# Patient Record
Sex: Male | Born: 1980
Health system: Southern US, Community
[De-identification: ages and names within clinical notes are randomized; demographics above are authoritative.]

## PROBLEM LIST (undated history)

## (undated) DIAGNOSIS — R569 Unspecified convulsions: Secondary | ICD-10-CM

---
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
## 2011-11-27 DIAGNOSIS — Z23 Encounter for immunization: Secondary | ICD-10-CM | POA: Diagnosis not present

## 2011-12-31 DIAGNOSIS — G9349 Other encephalopathy: Secondary | ICD-10-CM | POA: Diagnosis not present

## 2011-12-31 DIAGNOSIS — G40219 Localization-related (focal) (partial) symptomatic epilepsy and epileptic syndromes with complex partial seizures, intractable, without status epilepticus: Secondary | ICD-10-CM | POA: Diagnosis not present

## 2012-04-12 DIAGNOSIS — J3089 Other allergic rhinitis: Secondary | ICD-10-CM | POA: Diagnosis not present

## 2012-06-21 DIAGNOSIS — G9340 Encephalopathy, unspecified: Secondary | ICD-10-CM | POA: Diagnosis not present

## 2012-06-21 DIAGNOSIS — R569 Unspecified convulsions: Secondary | ICD-10-CM | POA: Diagnosis not present

## 2012-09-15 DIAGNOSIS — S20219A Contusion of unspecified front wall of thorax, initial encounter: Secondary | ICD-10-CM | POA: Diagnosis not present

## 2012-09-15 DIAGNOSIS — J00 Acute nasopharyngitis [common cold]: Secondary | ICD-10-CM | POA: Diagnosis not present

## 2012-11-21 DIAGNOSIS — R569 Unspecified convulsions: Secondary | ICD-10-CM | POA: Diagnosis not present

## 2012-11-21 DIAGNOSIS — Z23 Encounter for immunization: Secondary | ICD-10-CM | POA: Diagnosis not present

## 2012-11-21 DIAGNOSIS — G9340 Encephalopathy, unspecified: Secondary | ICD-10-CM | POA: Diagnosis not present

## 2012-12-22 DIAGNOSIS — H532 Diplopia: Secondary | ICD-10-CM | POA: Diagnosis not present

## 2013-01-05 DIAGNOSIS — J329 Chronic sinusitis, unspecified: Secondary | ICD-10-CM | POA: Diagnosis not present

## 2013-02-27 DIAGNOSIS — J3089 Other allergic rhinitis: Secondary | ICD-10-CM | POA: Diagnosis not present

## 2013-03-14 DIAGNOSIS — J00 Acute nasopharyngitis [common cold]: Secondary | ICD-10-CM | POA: Diagnosis not present

## 2013-06-30 ENCOUNTER — Emergency Department (HOSPITAL_COMMUNITY): Payer: Medicare Other

## 2013-06-30 ENCOUNTER — Encounter (HOSPITAL_COMMUNITY): Payer: Self-pay | Admitting: Emergency Medicine

## 2013-06-30 ENCOUNTER — Emergency Department (HOSPITAL_COMMUNITY)
Admission: EM | Admit: 2013-06-30 | Discharge: 2013-06-30 | Disposition: A | Discharge status: Home / Self Care | Payer: Medicare Other | Attending: Emergency Medicine | Admitting: Emergency Medicine

## 2013-06-30 DIAGNOSIS — Z79899 Other long term (current) drug therapy: Secondary | ICD-10-CM | POA: Insufficient documentation

## 2013-06-30 DIAGNOSIS — G9389 Other specified disorders of brain: Secondary | ICD-10-CM | POA: Diagnosis not present

## 2013-06-30 DIAGNOSIS — M25519 Pain in unspecified shoulder: Secondary | ICD-10-CM | POA: Diagnosis not present

## 2013-06-30 DIAGNOSIS — R51 Headache: Secondary | ICD-10-CM | POA: Diagnosis not present

## 2013-06-30 DIAGNOSIS — R569 Unspecified convulsions: Secondary | ICD-10-CM | POA: Diagnosis not present

## 2013-06-30 DIAGNOSIS — G40909 Epilepsy, unspecified, not intractable, without status epilepticus: Secondary | ICD-10-CM | POA: Insufficient documentation

## 2013-06-30 DIAGNOSIS — Z23 Encounter for immunization: Secondary | ICD-10-CM | POA: Diagnosis not present

## 2013-06-30 DIAGNOSIS — Y9229 Other specified public building as the place of occurrence of the external cause: Secondary | ICD-10-CM | POA: Insufficient documentation

## 2013-06-30 DIAGNOSIS — S01112A Laceration without foreign body of left eyelid and periocular area, initial encounter: Secondary | ICD-10-CM

## 2013-06-30 DIAGNOSIS — W1809XA Striking against other object with subsequent fall, initial encounter: Secondary | ICD-10-CM | POA: Insufficient documentation

## 2013-06-30 DIAGNOSIS — S0180XA Unspecified open wound of other part of head, initial encounter: Secondary | ICD-10-CM | POA: Diagnosis not present

## 2013-06-30 DIAGNOSIS — S4980XA Other specified injuries of shoulder and upper arm, unspecified arm, initial encounter: Secondary | ICD-10-CM | POA: Diagnosis not present

## 2013-06-30 DIAGNOSIS — Y9301 Activity, walking, marching and hiking: Secondary | ICD-10-CM | POA: Insufficient documentation

## 2013-06-30 HISTORY — DX: Unspecified convulsions: R56.9

## 2013-06-30 LAB — BASIC METABOLIC PANEL
Calcium: 9.3 mg/dL (ref 8.4–10.5)
GFR calc non Af Amer: >90 mL/min (ref >90)
Sodium: 137 mEq/L (ref 135–145)

## 2013-06-30 MED ORDER — IBUPROFEN 200 MG PO TABS
400.0000 mg | ORAL_TABLET | Freq: Once | ORAL | Status: AC
  Administered 2013-06-30: 400 mg via ORAL
  Filled 2013-06-30: qty 1

## 2013-06-30 MED ORDER — TETANUS-DIPHTH-ACELL PERTUSSIS 5-2.5-18.5 LF-MCG/0.5 IM SUSP
0.5000 mL | Freq: Once | INTRAMUSCULAR | Status: AC
  Administered 2013-06-30: 0.5 mL via INTRAMUSCULAR
  Filled 2013-06-30: qty 0.5

## 2013-06-30 NOTE — ED Notes (Signed)
Patient transported to X-ray 

## 2013-06-30 NOTE — ED Notes (Signed)
MD at bedside. 

## 2013-06-30 NOTE — ED Provider Notes (Addendum)
CSN: [id number]     Arrival date & time 06/30/13  1704 History     First MD Initiated Contact with Patient 06/30/13 1718     Chief Complaint  Patient presents with  . Seizures  . Head Injury   (Consider location/radiation/quality/duration/timing/severity/associated sxs/prior Treatment) Patient is a 32 y.o. male presenting with seizures and head injury. The history is provided by the patient.  Seizures Head Injury Associated symptoms: seizures   Associated symptoms: no neck pain, no numbness and no vomiting   pt with long hx seizure disorder, prior craniotomy w partial frontal lobectomy for same (without relief seizures), prior vagal n stimulator, who despite sz meds and above procedures continues to have a few to several seizures per week at baseline.  Today had 3 seizures, which family indicates is not unusual for pt. gen tc seizures, lasting 1-2 minutes.  Had same seizure today while walking in a store, fell forward, hit head/face, 3 cm lac to left eyebrow. Briefly posictal, now at baseline mental status. Pt c/o pain to laceration area. Also mild pain to left shoulder. Denies other pain or injury. No headache prior to seizure. No neck or back pain. No numbness/weakness. No cp or sob. No palpitations. No abd pain. No nvd. Compliant w normal meds, no recent change in meds. No recent inc in sz freq, although 3 today.  Family states often seizures will come in clusters, at baseline. Recent health, incl earlier today at baseline. No recent febrile illness.     Past Medical History  Diagnosis Date  . Seizures    Past Surgical History  Procedure Laterality Date  . Brain surgery Right 1992    2/3 frontal lobe removed.   No family history on file. History  Substance Use Topics  . Smoking status: Not on file  . Smokeless tobacco: Not on file  . Alcohol Use: Not on file    Review of Systems  Constitutional: Negative for fever and chills.  HENT: Negative for neck pain.   Eyes: Negative  for visual disturbance.  Respiratory: Negative for cough and shortness of breath.   Cardiovascular: Negative for chest pain.  Gastrointestinal: Negative for vomiting and abdominal pain.  Genitourinary: Negative for dysuria and flank pain.  Musculoskeletal: Negative for back pain.  Skin: Positive for wound. Negative for rash.  Neurological: Positive for seizures. Negative for weakness and numbness.  Hematological: Does not bruise/bleed easily.  Psychiatric/Behavioral: Negative for confusion.    Allergies  Review of patient's allergies indicates no known allergies.  Home Medications   Current Outpatient Rx  Name  Route  Sig  Dispense  Refill  . ibuprofen (ADVIL,MOTRIN) 200 MG tablet   Oral   Take 400 mg by mouth every 6 (six) hours as needed for pain.         Marland Kitchen lacosamide (VIMPAT) 200 MG TABS tablet   Oral   Take 400 mg by mouth 2 (two) times daily. Takes 2 tablets (400mg ) at 9am and 400mg  at 9pm         . lamoTRIgine (LAMICTAL) 200 MG tablet   Oral   Take 400 mg by mouth at bedtime. Takes 2 tablets (400mg ) at 9pm          BP 136/76  Pulse 94  Temp(Src) 98.4 F (36.9 C)  Resp 16  SpO2 95% Physical Exam  Nursing note and vitals reviewed. Constitutional: He is oriented to person, place, and time. He appears well-developed and well-nourished. No distress.  HENT:  Mouth/Throat: Oropharynx  is clear and moist.  3 cm left eyebrow lac. No septal hematoma. No malocclusion. No oral lac.   Eyes: Conjunctivae are normal. Pupils are equal, round, and reactive to light.  Neck: Normal range of motion. Neck supple. No tracheal deviation present.  Cardiovascular: Normal rate, regular rhythm, normal heart sounds and intact distal pulses.   Pulmonary/Chest: Effort normal and breath sounds normal. No accessory muscle usage. No respiratory distress. He exhibits no tenderness.  Abdominal: Soft. He exhibits no distension. There is no tenderness.  Genitourinary:  No cva tenderness   Musculoskeletal: Normal range of motion. He exhibits no edema and no tenderness.  Good rom bil ext, no pain or bony tenderness.   CTLS spine, non tender, aligned, no step off.   Neurological: He is alert and oriented to person, place, and time.  Motor intact bil. Steady gait.   Skin: Skin is warm and dry.  Psychiatric: He has a normal mood and affect.    ED Course   Procedures (including critical care time)  Results for orders placed during the hospital encounter of 06/30/13  BASIC METABOLIC PANEL      Result Value Range   Sodium 137  135 - 145 mEq/L   Potassium 3.9  3.5 - 5.1 mEq/L   Chloride 102  96 - 112 mEq/L   CO2 22  19 - 32 mEq/L   Glucose, Bld 97  70 - 99 mg/dL   BUN 11  6 - 23 mg/dL   Creatinine, Ser 1.61  0.50 - 1.35 mg/dL   Calcium 9.3  8.4 - 09.6 mg/dL   GFR calc non Af Amer >90  >90 mL/min   GFR calc Af Amer >90  >90 mL/min   Ct Head Wo Contrast  06/30/2013   *RADIOLOGY REPORT*  Clinical Data: Acute onset headache  CT HEAD WITHOUT CONTRAST  Technique:  Contiguous axial images were obtained from the base of the skull through the vertex without contrast. Study was obtained within 24 hours of patient arrival at the emergency department.  Comparison: None.  Findings:  The patient is status post right frontal craniotomy. There is ex vacuo phenomenon throughout the mid to superior right frontal lobe with several areas of calcification along the dura, nonacute.  There is enlargement of the lateral ventricles, more on the right than on the left.  Third and fourth ventricle appear within normal limits.  There is no mass, acute hemorrhage, extra-axial fluid, or midline shift.  There is no evidence of acute infarct.  There is some small vessel disease in the corpus callosum region anteriorly and superiorly.  Elsewhere gray-white compartments are normal.  No acute fracture is seen in the bony calvarium.  The mastoid air cells are clear.    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  Calcification along the dura in the anterior right frontal region is felt to represent residua of old trauma.  This is a chronic finding.  There is enlargement of the lateral ventricles, probably due to the ex vacuo phenomenon on the right.  There is no acute hemorrhage or mass effect.  No acute appearing infarct seen.   Original Report Authenticated By: Bretta Bang, M.D.   Dg Shoulder Left  06/30/2013   *RADIOLOGY REPORT*  Clinical Data: Pain post trauma  LEFT SHOULDER - 2+ VIEW  Comparison: None.  Findings: Frontal, oblique, and Y scapular images were obtained. There is no demonstrable fracture or dislocation.  Joint spaces appear  intact.  No erosive change or intra-articular calcifications.  There is a stimulator with lead tip in left neck region.  IMPRESSION: No fracture or dislocation.  No appreciable arthropathy.   Original Report Authenticated By: Bretta Bang, M.D.      MDM  Ct. Lab.  Reviewed nursing notes and prior charts for additional history.   Ct neg acute.  Pt requests advil for headache, states has this same kind of headache after seizures, and usually takes ibuprofen.   Motrin po.  Recheck spine nt. Mental status remains at baseline. No headache. No new c/o.  LACERATION REPAIR Performed by: Suzi Roots Authorized by: Suzi Roots Consent: Verbal consent obtained. Risks and benefits: risks, benefits and alternatives were discussed Consent given by: patient Patient identity confirmed: provided demographic data Prepped and Draped in normal sterile fashion Wound explored  Laceration Location: left eyebrow  Laceration Length: 3cm  No Foreign Bodies seen or palpated  Anesthesia: local infiltration  Local anesthetic: lidocaine 2% wo epinephrine  Anesthetic total: 3 ml  Irrigation method: syringe Amount of cleaning: standard  Skin closure: complex laceration, layered closure, skin 6-0  prolene, subcut 5-0 vicryl  Number of sutures: 3 sub cut, 7 skin  Technique: layered,complex closure  Patient tolerance: Patient tolerated the procedure well with no immediate complications.   Tetanus im.  Pt at baseline, ct head neg acute  -  Pt appears stable for d/c.      Suzi Roots, MD 06/30/13 [phone]

## 2013-06-30 NOTE — ED Notes (Signed)
Bed: WA23 Expected date:  Expected time:  Means of arrival:  Comments: EMS seizure 

## 2013-06-30 NOTE — ED Notes (Addendum)
Per EMS patient with Hx of fall in 1992 with injury requiring removal of 2/3 of right frontal lobe of the brain, and a second surgery in 1995 to place vagal nerve stimulator in his chest. Per EMS patient had 3 seizures today and fell in the most recent seizure, with a head laceration above and below the left eye which is bleeding into the eye. 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 EMS reports that patient is still post-ictal. Patient follows commands and is able to ambulate unsteadily. Per family patient is "normal" mentally at baseline and can completely care for himself, but that he has ongoing seizures anywhere from several times per day to several days without a seizure, after which the patient has a headache and confusion. Family states they witnessed patient fall headfirst onto ground. Patient complains of pain in head and left arm.

## 2013-07-07 ENCOUNTER — Emergency Department (HOSPITAL_COMMUNITY)
Admission: EM | Admit: 2013-07-07 | Discharge: 2013-07-07 | Disposition: A | Discharge status: Home / Self Care | Payer: Medicare Other | Attending: Emergency Medicine | Admitting: Emergency Medicine

## 2013-07-07 ENCOUNTER — Encounter (HOSPITAL_COMMUNITY): Payer: Self-pay | Admitting: Emergency Medicine

## 2013-07-07 DIAGNOSIS — G40909 Epilepsy, unspecified, not intractable, without status epilepticus: Secondary | ICD-10-CM | POA: Diagnosis not present

## 2013-07-07 DIAGNOSIS — Z79899 Other long term (current) drug therapy: Secondary | ICD-10-CM | POA: Diagnosis not present

## 2013-07-07 DIAGNOSIS — Z4802 Encounter for removal of sutures: Secondary | ICD-10-CM | POA: Diagnosis not present

## 2013-07-07 NOTE — ED Provider Notes (Signed)
Medical screening examination/treatment/procedure(s) were performed by non-physician practitioner and as supervising physician I was immediately available for consultation/collaboration.   Gwyneth Sprout, MD 07/07/13 [phone]

## 2013-07-07 NOTE — ED Provider Notes (Signed)
  CSN: [id number]     Arrival date & time 07/07/13  1336 History     None    Chief Complaint  Patient presents with  . Suture / Staple Removal   (Consider location/radiation/quality/duration/timing/severity/associated sxs/prior Treatment) HPI Comments: Patient presents today to have sutures removed from his left eyebrow area.  Sutures were placed seven days ago.  He denies fever or chills.  No drainage from the wound.  He reports that the area has been healing well.  He has not been applying any treatment to the area.  Patient is a 32 y.o. male presenting with suture removal. The history is provided by the patient.  Suture / Staple Removal    Past Medical History  Diagnosis Date  . Seizures    Past Surgical History  Procedure Laterality Date  . Brain surgery Right 1992    2/3 frontal lobe removed.   No family history on file. History  Substance Use Topics  . Smoking status: Never Smoker   . Smokeless tobacco: Not on file  . Alcohol Use: No    Review of Systems  Allergies  Review of patient's allergies indicates no known allergies.  Home Medications   Current Outpatient Rx  Name  Route  Sig  Dispense  Refill  . clonazePAM (KLONOPIN) 0.5 MG tablet   Oral   Take 0.5 mg by mouth at bedtime as needed for anxiety.         Marland Kitchen ibuprofen (ADVIL,MOTRIN) 200 MG tablet   Oral   Take 400 mg by mouth every 6 (six) hours as needed for pain.         Marland Kitchen lacosamide (VIMPAT) 200 MG TABS tablet   Oral   Take 400 mg by mouth 2 (two) times daily. Takes 2 tablets (400mg ) at 9am and 400mg  at 9pm         . lamoTRIgine (LAMICTAL) 200 MG tablet   Oral   Take 400 mg by mouth at bedtime. Takes 2 tablets (400mg ) at 9pm          BP 122/73  Pulse 72  Temp(Src) 98.5 F (36.9 C) (Oral)  Resp 20  SpO2 100% Physical Exam  Nursing note and vitals reviewed. Constitutional: He appears well-developed and well-nourished. No distress.  HENT:  Head: Normocephalic.  Cardiovascular:  Normal rate, regular rhythm and normal heart sounds.   Pulmonary/Chest: Effort normal and breath sounds normal.  Neurological: He is alert.  Skin: Skin is warm and dry. He is not diaphoretic.  Laceration appears to be healing well.  Skin well approximated.  No drainage.  No surrounding erythema, edema, or swelling.  Psychiatric: He has a normal mood and affect.    ED Course   Procedures (including critical care time)  Labs Reviewed - No data to display No results found. No diagnosis found.  MDM  Patient presenting for suture removal.  No signs of infection at this time.  Sutures removed without difficulty.  Patient stable for discharge.  Pascal Lux [place], PA-C 07/07/13 1426

## 2013-07-07 NOTE — ED Notes (Signed)
Pt back for suture removal over left eye.

## 2013-07-10 DIAGNOSIS — Z462 Encounter for fitting and adjustment of other devices related to nervous system and special senses: Secondary | ICD-10-CM | POA: Diagnosis not present

## 2013-07-10 DIAGNOSIS — R569 Unspecified convulsions: Secondary | ICD-10-CM | POA: Diagnosis not present

## 2013-07-10 DIAGNOSIS — G9340 Encephalopathy, unspecified: Secondary | ICD-10-CM | POA: Diagnosis not present

## 2013-09-01 DIAGNOSIS — Z23 Encounter for immunization: Secondary | ICD-10-CM | POA: Diagnosis not present

## 2013-10-16 DIAGNOSIS — G40219 Localization-related (focal) (partial) symptomatic epilepsy and epileptic syndromes with complex partial seizures, intractable, without status epilepticus: Secondary | ICD-10-CM | POA: Diagnosis not present

## 2013-10-16 DIAGNOSIS — Z9889 Other specified postprocedural states: Secondary | ICD-10-CM | POA: Diagnosis not present

## 2013-10-16 DIAGNOSIS — G9340 Encephalopathy, unspecified: Secondary | ICD-10-CM | POA: Diagnosis not present

## 2014-01-24 DIAGNOSIS — R51 Headache: Secondary | ICD-10-CM | POA: Diagnosis not present

## 2014-01-24 DIAGNOSIS — H01019 Ulcerative blepharitis unspecified eye, unspecified eyelid: Secondary | ICD-10-CM | POA: Diagnosis not present

## 2014-02-13 DIAGNOSIS — G40219 Localization-related (focal) (partial) symptomatic epilepsy and epileptic syndromes with complex partial seizures, intractable, without status epilepticus: Secondary | ICD-10-CM | POA: Diagnosis not present

## 2014-02-13 DIAGNOSIS — Z9889 Other specified postprocedural states: Secondary | ICD-10-CM | POA: Diagnosis not present

## 2014-03-15 DIAGNOSIS — J3089 Other allergic rhinitis: Secondary | ICD-10-CM | POA: Diagnosis not present

## 2014-07-10 DIAGNOSIS — Z9889 Other specified postprocedural states: Secondary | ICD-10-CM | POA: Diagnosis not present

## 2014-07-10 DIAGNOSIS — G9340 Encephalopathy, unspecified: Secondary | ICD-10-CM | POA: Diagnosis not present

## 2014-07-10 DIAGNOSIS — R569 Unspecified convulsions: Secondary | ICD-10-CM | POA: Diagnosis not present

## 2014-07-10 DIAGNOSIS — G40219 Localization-related (focal) (partial) symptomatic epilepsy and epileptic syndromes with complex partial seizures, intractable, without status epilepticus: Secondary | ICD-10-CM | POA: Diagnosis not present

## 2014-08-03 DIAGNOSIS — R569 Unspecified convulsions: Secondary | ICD-10-CM | POA: Diagnosis not present

## 2014-08-29 DIAGNOSIS — Z23 Encounter for immunization: Secondary | ICD-10-CM | POA: Diagnosis not present

## 2014-09-29 DIAGNOSIS — S62608A Fracture of unspecified phalanx of other finger, initial encounter for closed fracture: Secondary | ICD-10-CM | POA: Diagnosis not present

## 2014-09-29 DIAGNOSIS — S62600A Fracture of unspecified phalanx of right index finger, initial encounter for closed fracture: Secondary | ICD-10-CM | POA: Diagnosis not present

## 2014-09-29 DIAGNOSIS — S62647A Nondisplaced fracture of proximal phalanx of left little finger, initial encounter for closed fracture: Secondary | ICD-10-CM | POA: Diagnosis not present

## 2014-09-29 DIAGNOSIS — W1839XA Other fall on same level, initial encounter: Secondary | ICD-10-CM | POA: Diagnosis not present

## 2014-09-29 DIAGNOSIS — S62645A Nondisplaced fracture of proximal phalanx of left ring finger, initial encounter for closed fracture: Secondary | ICD-10-CM | POA: Diagnosis not present

## 2014-09-29 DIAGNOSIS — S62613A Displaced fracture of proximal phalanx of left middle finger, initial encounter for closed fracture: Secondary | ICD-10-CM | POA: Diagnosis not present

## 2014-10-04 DIAGNOSIS — S62613A Displaced fracture of proximal phalanx of left middle finger, initial encounter for closed fracture: Secondary | ICD-10-CM | POA: Diagnosis not present

## 2014-10-05 DIAGNOSIS — S62617A Displaced fracture of proximal phalanx of left little finger, initial encounter for closed fracture: Secondary | ICD-10-CM | POA: Diagnosis not present

## 2014-10-05 DIAGNOSIS — G40909 Epilepsy, unspecified, not intractable, without status epilepticus: Secondary | ICD-10-CM | POA: Diagnosis not present

## 2014-10-05 DIAGNOSIS — S62615A Displaced fracture of proximal phalanx of left ring finger, initial encounter for closed fracture: Secondary | ICD-10-CM | POA: Diagnosis not present

## 2014-10-05 DIAGNOSIS — Z79899 Other long term (current) drug therapy: Secondary | ICD-10-CM | POA: Diagnosis not present

## 2014-10-05 DIAGNOSIS — S62613A Displaced fracture of proximal phalanx of left middle finger, initial encounter for closed fracture: Secondary | ICD-10-CM | POA: Diagnosis not present

## 2014-10-05 DIAGNOSIS — Y999 Unspecified external cause status: Secondary | ICD-10-CM | POA: Diagnosis not present

## 2014-10-15 DIAGNOSIS — S62613A Displaced fracture of proximal phalanx of left middle finger, initial encounter for closed fracture: Secondary | ICD-10-CM | POA: Diagnosis not present

## 2014-10-23 DIAGNOSIS — Z9689 Presence of other specified functional implants: Secondary | ICD-10-CM | POA: Diagnosis not present

## 2014-10-23 DIAGNOSIS — G40219 Localization-related (focal) (partial) symptomatic epilepsy and epileptic syndromes with complex partial seizures, intractable, without status epilepticus: Secondary | ICD-10-CM | POA: Diagnosis not present

## 2014-10-25 DIAGNOSIS — S62613D Displaced fracture of proximal phalanx of left middle finger, subsequent encounter for fracture with routine healing: Secondary | ICD-10-CM | POA: Diagnosis not present

## 2014-11-26 DIAGNOSIS — S62613D Displaced fracture of proximal phalanx of left middle finger, subsequent encounter for fracture with routine healing: Secondary | ICD-10-CM | POA: Diagnosis not present

## 2014-11-26 DIAGNOSIS — S62617D Displaced fracture of proximal phalanx of left little finger, subsequent encounter for fracture with routine healing: Secondary | ICD-10-CM | POA: Diagnosis not present

## 2014-12-12 DIAGNOSIS — M79642 Pain in left hand: Secondary | ICD-10-CM | POA: Diagnosis not present

## 2014-12-12 DIAGNOSIS — M25642 Stiffness of left hand, not elsewhere classified: Secondary | ICD-10-CM | POA: Diagnosis not present

## 2014-12-12 DIAGNOSIS — M25442 Effusion, left hand: Secondary | ICD-10-CM | POA: Diagnosis not present

## 2014-12-12 DIAGNOSIS — S62613D Displaced fracture of proximal phalanx of left middle finger, subsequent encounter for fracture with routine healing: Secondary | ICD-10-CM | POA: Diagnosis not present

## 2014-12-17 DIAGNOSIS — M25642 Stiffness of left hand, not elsewhere classified: Secondary | ICD-10-CM | POA: Diagnosis not present

## 2014-12-17 DIAGNOSIS — S62613D Displaced fracture of proximal phalanx of left middle finger, subsequent encounter for fracture with routine healing: Secondary | ICD-10-CM | POA: Diagnosis not present

## 2014-12-17 DIAGNOSIS — M25442 Effusion, left hand: Secondary | ICD-10-CM | POA: Diagnosis not present

## 2014-12-17 DIAGNOSIS — M79642 Pain in left hand: Secondary | ICD-10-CM | POA: Diagnosis not present

## 2014-12-20 DIAGNOSIS — S62613D Displaced fracture of proximal phalanx of left middle finger, subsequent encounter for fracture with routine healing: Secondary | ICD-10-CM | POA: Diagnosis not present

## 2014-12-20 DIAGNOSIS — M25442 Effusion, left hand: Secondary | ICD-10-CM | POA: Diagnosis not present

## 2014-12-20 DIAGNOSIS — M25642 Stiffness of left hand, not elsewhere classified: Secondary | ICD-10-CM | POA: Diagnosis not present

## 2014-12-20 DIAGNOSIS — M79642 Pain in left hand: Secondary | ICD-10-CM | POA: Diagnosis not present

## 2014-12-24 DIAGNOSIS — S62613D Displaced fracture of proximal phalanx of left middle finger, subsequent encounter for fracture with routine healing: Secondary | ICD-10-CM | POA: Diagnosis not present

## 2014-12-24 DIAGNOSIS — M25642 Stiffness of left hand, not elsewhere classified: Secondary | ICD-10-CM | POA: Diagnosis not present

## 2014-12-24 DIAGNOSIS — S62615A Displaced fracture of proximal phalanx of left ring finger, initial encounter for closed fracture: Secondary | ICD-10-CM | POA: Diagnosis not present

## 2014-12-24 DIAGNOSIS — S62617A Displaced fracture of proximal phalanx of left little finger, initial encounter for closed fracture: Secondary | ICD-10-CM | POA: Diagnosis not present

## 2014-12-24 DIAGNOSIS — S62613A Displaced fracture of proximal phalanx of left middle finger, initial encounter for closed fracture: Secondary | ICD-10-CM | POA: Diagnosis not present

## 2014-12-24 DIAGNOSIS — M25442 Effusion, left hand: Secondary | ICD-10-CM | POA: Diagnosis not present

## 2014-12-24 DIAGNOSIS — M79642 Pain in left hand: Secondary | ICD-10-CM | POA: Diagnosis not present

## 2014-12-27 DIAGNOSIS — M25442 Effusion, left hand: Secondary | ICD-10-CM | POA: Diagnosis not present

## 2014-12-27 DIAGNOSIS — S62613D Displaced fracture of proximal phalanx of left middle finger, subsequent encounter for fracture with routine healing: Secondary | ICD-10-CM | POA: Diagnosis not present

## 2014-12-27 DIAGNOSIS — M79642 Pain in left hand: Secondary | ICD-10-CM | POA: Diagnosis not present

## 2014-12-27 DIAGNOSIS — M25642 Stiffness of left hand, not elsewhere classified: Secondary | ICD-10-CM | POA: Diagnosis not present

## 2015-01-03 DIAGNOSIS — M25642 Stiffness of left hand, not elsewhere classified: Secondary | ICD-10-CM | POA: Diagnosis not present

## 2015-01-03 DIAGNOSIS — M79642 Pain in left hand: Secondary | ICD-10-CM | POA: Diagnosis not present

## 2015-01-03 DIAGNOSIS — M25442 Effusion, left hand: Secondary | ICD-10-CM | POA: Diagnosis not present

## 2015-01-03 DIAGNOSIS — S62613D Displaced fracture of proximal phalanx of left middle finger, subsequent encounter for fracture with routine healing: Secondary | ICD-10-CM | POA: Diagnosis not present

## 2015-01-04 DIAGNOSIS — J329 Chronic sinusitis, unspecified: Secondary | ICD-10-CM | POA: Diagnosis not present

## 2015-01-04 DIAGNOSIS — R42 Dizziness and giddiness: Secondary | ICD-10-CM | POA: Diagnosis not present

## 2015-01-04 DIAGNOSIS — Z131 Encounter for screening for diabetes mellitus: Secondary | ICD-10-CM | POA: Diagnosis not present

## 2015-01-10 DIAGNOSIS — S62613D Displaced fracture of proximal phalanx of left middle finger, subsequent encounter for fracture with routine healing: Secondary | ICD-10-CM | POA: Diagnosis not present

## 2015-01-10 DIAGNOSIS — M25642 Stiffness of left hand, not elsewhere classified: Secondary | ICD-10-CM | POA: Diagnosis not present

## 2015-01-10 DIAGNOSIS — M25442 Effusion, left hand: Secondary | ICD-10-CM | POA: Diagnosis not present

## 2015-01-10 DIAGNOSIS — M79642 Pain in left hand: Secondary | ICD-10-CM | POA: Diagnosis not present

## 2015-01-17 DIAGNOSIS — M25642 Stiffness of left hand, not elsewhere classified: Secondary | ICD-10-CM | POA: Diagnosis not present

## 2015-01-17 DIAGNOSIS — M25442 Effusion, left hand: Secondary | ICD-10-CM | POA: Diagnosis not present

## 2015-01-17 DIAGNOSIS — M79642 Pain in left hand: Secondary | ICD-10-CM | POA: Diagnosis not present

## 2015-01-17 DIAGNOSIS — S62613D Displaced fracture of proximal phalanx of left middle finger, subsequent encounter for fracture with routine healing: Secondary | ICD-10-CM | POA: Diagnosis not present

## 2015-01-24 DIAGNOSIS — M25442 Effusion, left hand: Secondary | ICD-10-CM | POA: Diagnosis not present

## 2015-01-24 DIAGNOSIS — S62613D Displaced fracture of proximal phalanx of left middle finger, subsequent encounter for fracture with routine healing: Secondary | ICD-10-CM | POA: Diagnosis not present

## 2015-01-24 DIAGNOSIS — M25642 Stiffness of left hand, not elsewhere classified: Secondary | ICD-10-CM | POA: Diagnosis not present

## 2015-01-24 DIAGNOSIS — M79642 Pain in left hand: Secondary | ICD-10-CM | POA: Diagnosis not present

## 2015-01-28 DIAGNOSIS — S62617D Displaced fracture of proximal phalanx of left little finger, subsequent encounter for fracture with routine healing: Secondary | ICD-10-CM | POA: Diagnosis not present

## 2015-01-28 DIAGNOSIS — S62615D Displaced fracture of proximal phalanx of left ring finger, subsequent encounter for fracture with routine healing: Secondary | ICD-10-CM | POA: Diagnosis not present

## 2015-01-28 DIAGNOSIS — S62613D Displaced fracture of proximal phalanx of left middle finger, subsequent encounter for fracture with routine healing: Secondary | ICD-10-CM | POA: Diagnosis not present

## 2015-02-10 IMAGING — CT CT HEAD W/O CM
2 series · 15 of 30 positions shown, 19 images · non-contrast
Comparison: None.

CLINICAL DATA: Acute onset headache

CT HEAD WITHOUT CONTRAST
TECHNIQUE: Contiguous axial images were obtained from the base of
the skull through the vertex without contrast. Study was obtained
within 24 hours of patient arrival at the emergency department.

[Series 2: head w/o · axial · non-contrast · 0.48mm/px · z∈[+1004,+1134]mm · 13 of 32 slices shown, 17 images]
[im 3/32  brain]
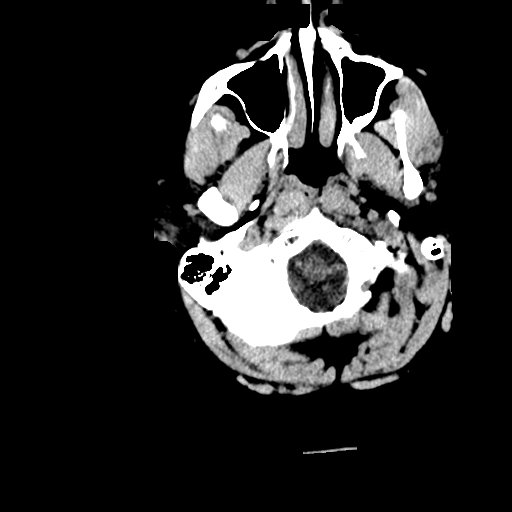
[im 3/32  bone]
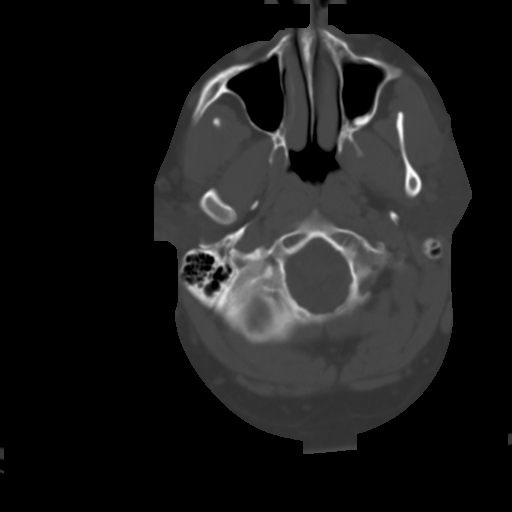
[im 5/32  brain]
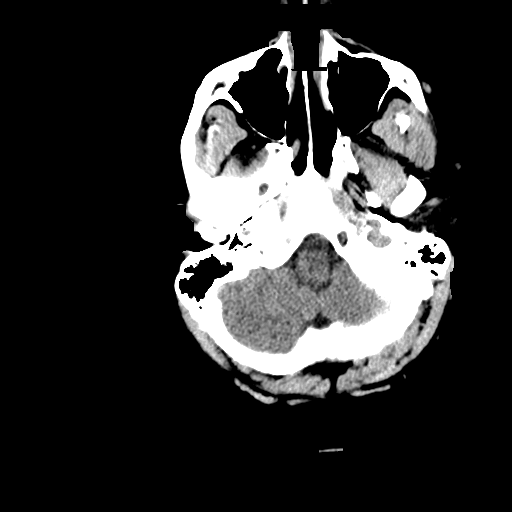
[im 7/32  brain]
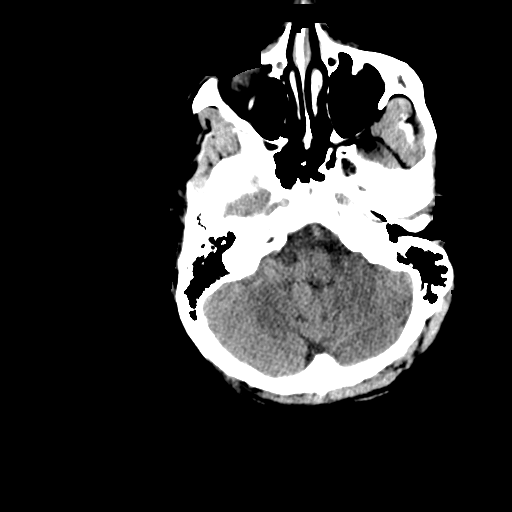
[im 9/32  brain]
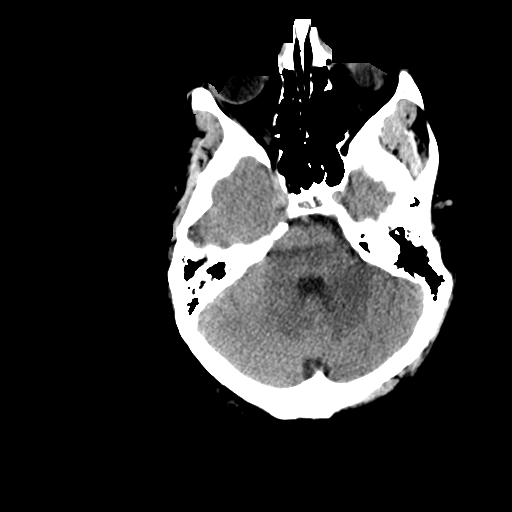
[im 12/32  brain]
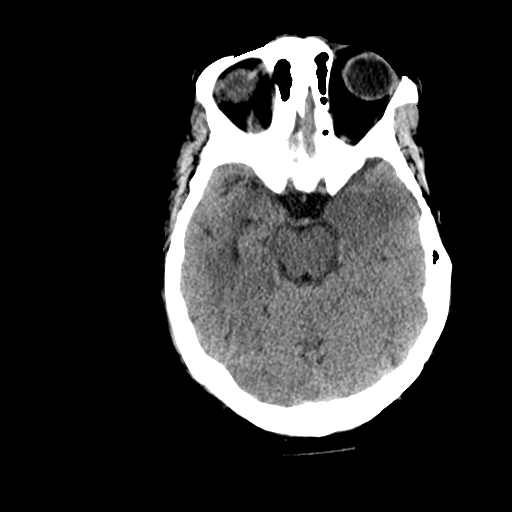
[im 12/32  bone]
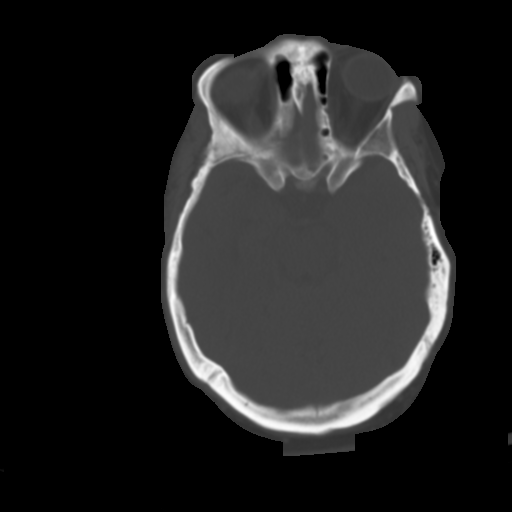
[im 14/32  brain]
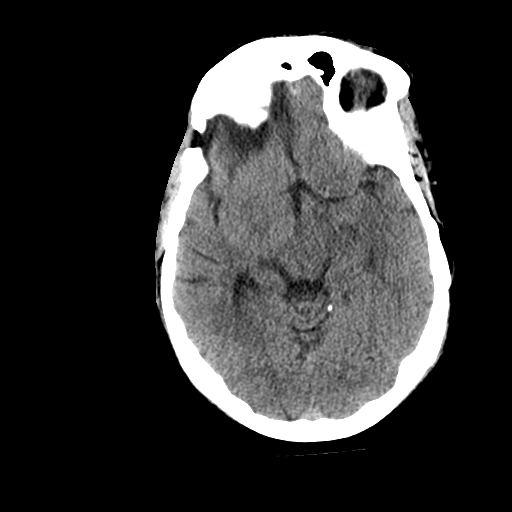
[im 16/32  brain]
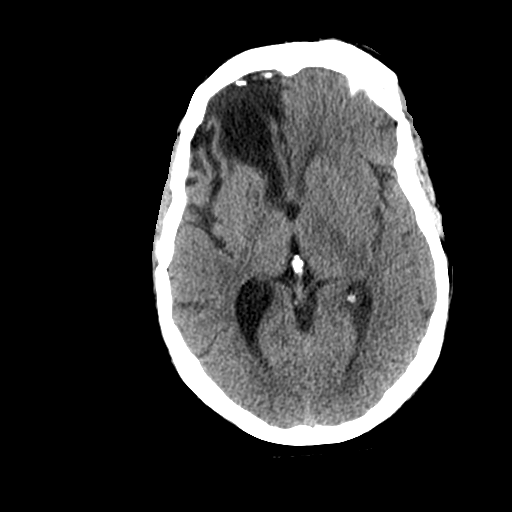
[im 18/32  brain]
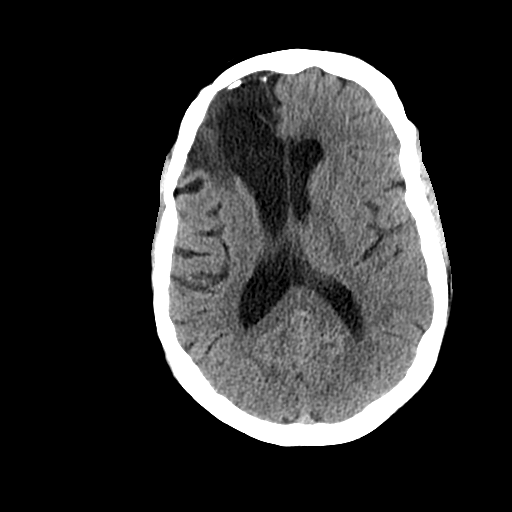
[im 20/32  brain]
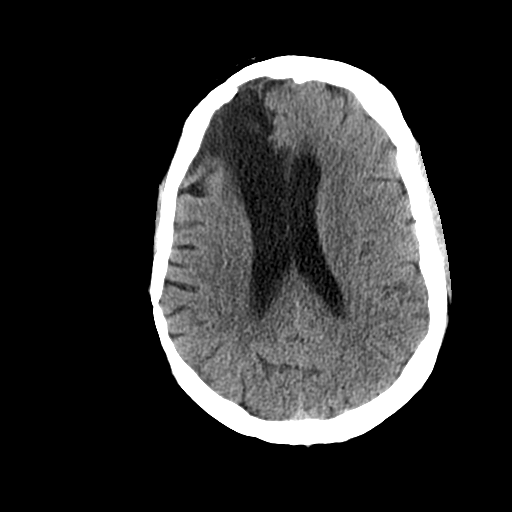
[im 20/32  bone]
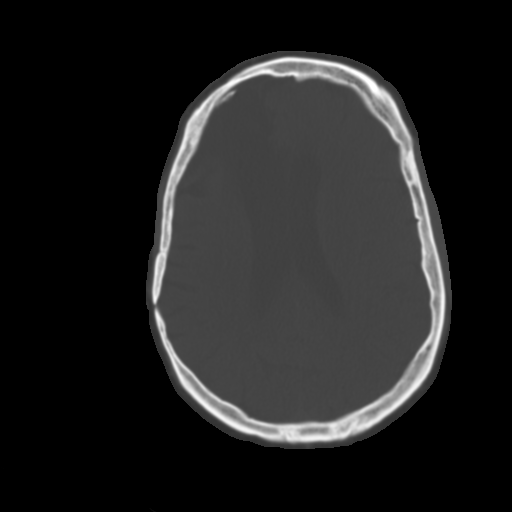
[im 23/32  brain]
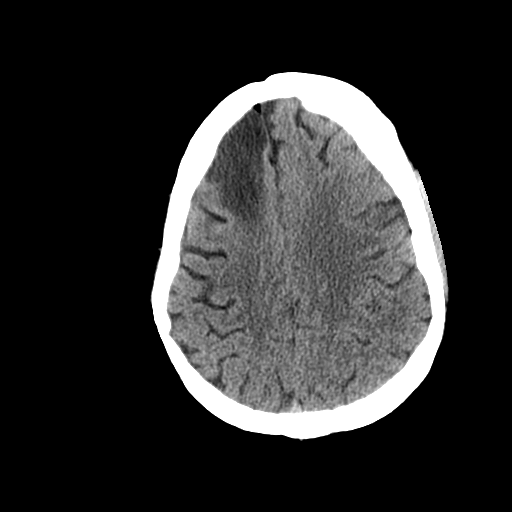
[im 25/32  brain]
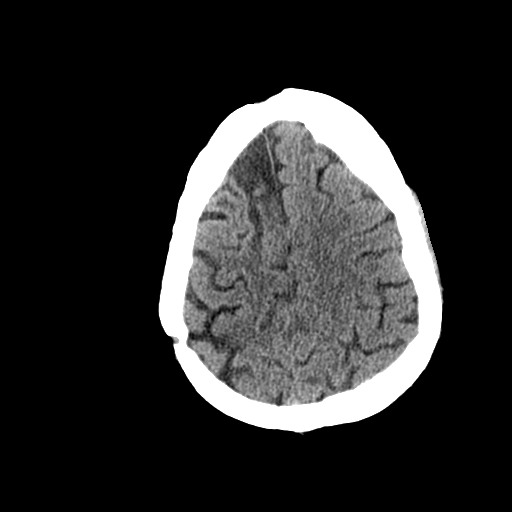
[im 27/32  brain]
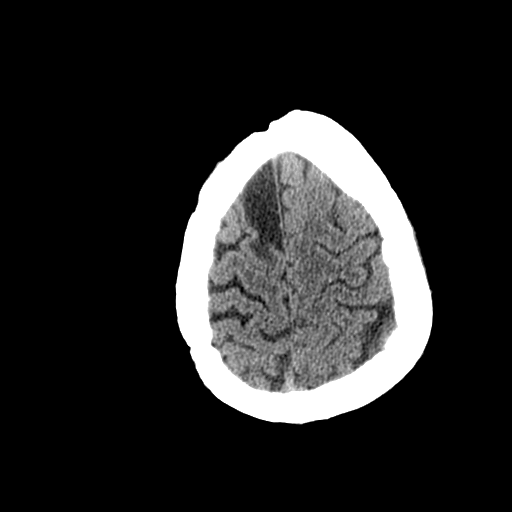
[im 29/32  brain]
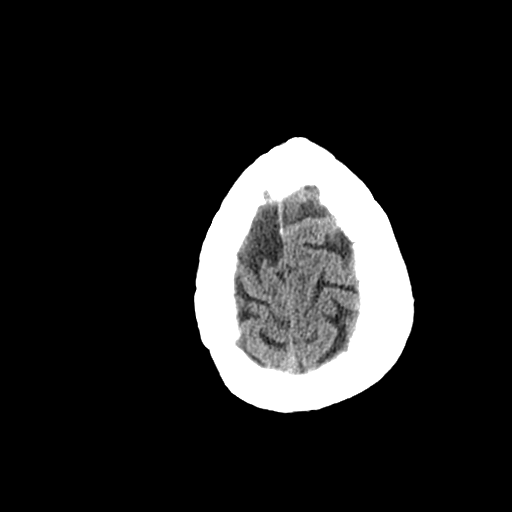
[im 29/32  bone]
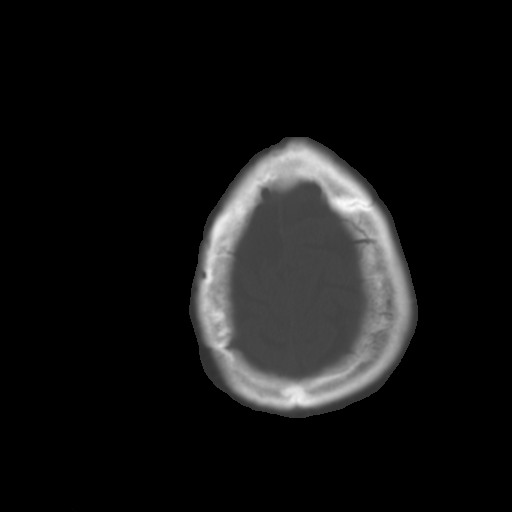

[Series 3: bone windows · axial · 0.48mm/px · z∈[+1004,+1024]mm · 2 of 32 slices shown]
[im 3/32  bone]
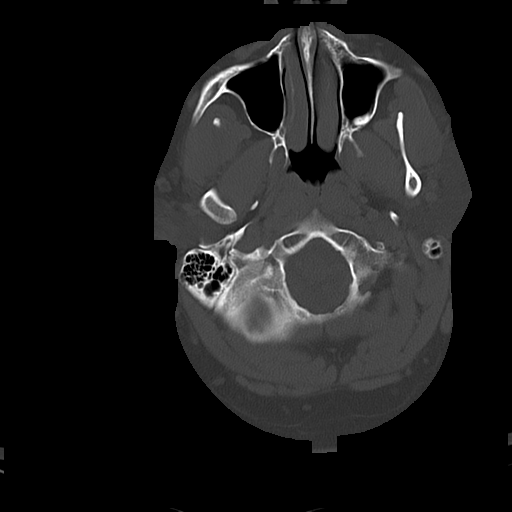
[im 7/32  bone]
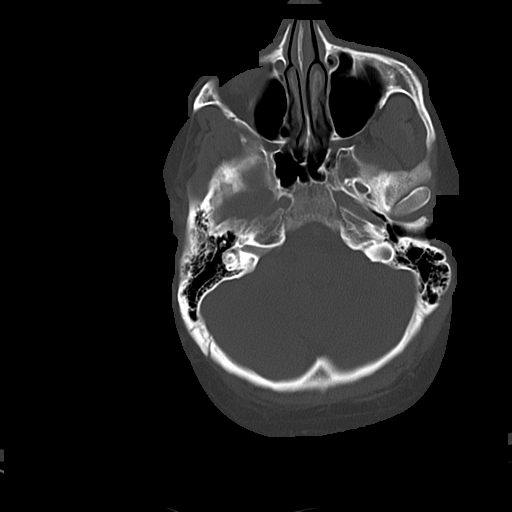

[15 of 30 positions shown; findings below may reference images not displayed]

FINDINGS: The patient is status post right frontal craniotomy.
There is ex vacuo phenomenon throughout the mid to superior right
frontal lobe with several areas of calcification along the dura,
nonacute.  There is enlargement of the lateral ventricles, more on
the right than on the left.  Third and fourth ventricle appear
within normal limits.

There is no mass, acute hemorrhage, extra-axial fluid, or midline
shift.  There is no evidence of acute infarct.  There is some small
vessel disease in the corpus callosum region anteriorly and
superiorly.  Elsewhere gray-white compartments are normal.

No acute fracture is seen in the bony calvarium.  The mastoid air
cells are clear.
IMPRESSION: Encephalomalacia throughout much of the right
frontal lobe with ex vacuo phenomenon.  Calcification along the
dura in the anterior right frontal region is felt to represent
residua of old trauma.  This is a chronic finding.  There is
enlargement of the lateral ventricles, probably due to the ex vacuo
phenomenon on the right.  There is no acute hemorrhage or mass
effect.  No acute appearing infarct seen.

## 2015-03-04 DIAGNOSIS — J302 Other seasonal allergic rhinitis: Secondary | ICD-10-CM | POA: Diagnosis not present

## 2015-04-09 DIAGNOSIS — D3132 Benign neoplasm of left choroid: Secondary | ICD-10-CM | POA: Diagnosis not present

## 2015-04-09 DIAGNOSIS — H5213 Myopia, bilateral: Secondary | ICD-10-CM | POA: Diagnosis not present

## 2015-05-21 DIAGNOSIS — Z9689 Presence of other specified functional implants: Secondary | ICD-10-CM | POA: Diagnosis not present

## 2015-05-21 DIAGNOSIS — G40219 Localization-related (focal) (partial) symptomatic epilepsy and epileptic syndromes with complex partial seizures, intractable, without status epilepticus: Secondary | ICD-10-CM | POA: Diagnosis not present

## 2015-05-21 DIAGNOSIS — R569 Unspecified convulsions: Secondary | ICD-10-CM | POA: Diagnosis not present

## 2015-08-09 DIAGNOSIS — Z23 Encounter for immunization: Secondary | ICD-10-CM | POA: Diagnosis not present

## 2015-10-07 DIAGNOSIS — Z79899 Other long term (current) drug therapy: Secondary | ICD-10-CM | POA: Diagnosis not present

## 2015-10-07 DIAGNOSIS — R569 Unspecified convulsions: Secondary | ICD-10-CM | POA: Diagnosis not present

## 2015-10-07 DIAGNOSIS — S0181XA Laceration without foreign body of other part of head, initial encounter: Secondary | ICD-10-CM | POA: Diagnosis not present

## 2015-10-07 DIAGNOSIS — S61213A Laceration without foreign body of left middle finger without damage to nail, initial encounter: Secondary | ICD-10-CM | POA: Diagnosis not present

## 2015-11-26 DIAGNOSIS — J329 Chronic sinusitis, unspecified: Secondary | ICD-10-CM | POA: Diagnosis not present

## 2016-02-10 DIAGNOSIS — K529 Noninfective gastroenteritis and colitis, unspecified: Secondary | ICD-10-CM | POA: Diagnosis not present

## 2016-03-04 DIAGNOSIS — J302 Other seasonal allergic rhinitis: Secondary | ICD-10-CM | POA: Diagnosis not present

## 2016-07-07 DIAGNOSIS — Z23 Encounter for immunization: Secondary | ICD-10-CM | POA: Diagnosis not present

## 2016-07-30 DIAGNOSIS — J329 Chronic sinusitis, unspecified: Secondary | ICD-10-CM | POA: Diagnosis not present

## 2016-08-03 DIAGNOSIS — J209 Acute bronchitis, unspecified: Secondary | ICD-10-CM | POA: Diagnosis not present

## 2016-09-07 DIAGNOSIS — Z1389 Encounter for screening for other disorder: Secondary | ICD-10-CM | POA: Diagnosis not present

## 2016-09-07 DIAGNOSIS — M26629 Arthralgia of temporomandibular joint, unspecified side: Secondary | ICD-10-CM | POA: Diagnosis not present

## 2016-09-15 DIAGNOSIS — D3132 Benign neoplasm of left choroid: Secondary | ICD-10-CM | POA: Diagnosis not present

## 2016-09-15 DIAGNOSIS — H5213 Myopia, bilateral: Secondary | ICD-10-CM | POA: Diagnosis not present

## 2016-10-13 DIAGNOSIS — J329 Chronic sinusitis, unspecified: Secondary | ICD-10-CM | POA: Diagnosis not present

## 2017-03-01 DIAGNOSIS — R6884 Jaw pain: Secondary | ICD-10-CM | POA: Diagnosis not present

## 2017-03-01 DIAGNOSIS — Z6834 Body mass index (BMI) 34.0-34.9, adult: Secondary | ICD-10-CM | POA: Diagnosis not present

## 2017-03-04 DIAGNOSIS — G40219 Localization-related (focal) (partial) symptomatic epilepsy and epileptic syndromes with complex partial seizures, intractable, without status epilepticus: Secondary | ICD-10-CM | POA: Diagnosis not present

## 2017-03-04 DIAGNOSIS — R569 Unspecified convulsions: Secondary | ICD-10-CM | POA: Diagnosis not present

## 2017-03-04 DIAGNOSIS — Z9689 Presence of other specified functional implants: Secondary | ICD-10-CM | POA: Diagnosis not present

## 2017-03-15 DIAGNOSIS — S02601B Fracture of unspecified part of body of right mandible, initial encounter for open fracture: Secondary | ICD-10-CM | POA: Diagnosis not present

## 2017-03-15 DIAGNOSIS — S0993XA Unspecified injury of face, initial encounter: Secondary | ICD-10-CM | POA: Diagnosis not present

## 2017-03-15 DIAGNOSIS — G40909 Epilepsy, unspecified, not intractable, without status epilepticus: Secondary | ICD-10-CM | POA: Diagnosis not present

## 2017-03-16 DIAGNOSIS — S0993XA Unspecified injury of face, initial encounter: Secondary | ICD-10-CM | POA: Diagnosis not present

## 2017-03-16 DIAGNOSIS — S02609A Fracture of mandible, unspecified, initial encounter for closed fracture: Secondary | ICD-10-CM | POA: Diagnosis not present

## 2017-03-16 DIAGNOSIS — G40909 Epilepsy, unspecified, not intractable, without status epilepticus: Secondary | ICD-10-CM | POA: Diagnosis not present

## 2017-03-16 DIAGNOSIS — Z8781 Personal history of (healed) traumatic fracture: Secondary | ICD-10-CM | POA: Diagnosis not present

## 2017-03-16 DIAGNOSIS — S02601A Fracture of unspecified part of body of right mandible, initial encounter for closed fracture: Secondary | ICD-10-CM | POA: Diagnosis not present

## 2017-03-16 DIAGNOSIS — G40919 Epilepsy, unspecified, intractable, without status epilepticus: Secondary | ICD-10-CM | POA: Diagnosis not present

## 2017-03-16 DIAGNOSIS — S02601B Fracture of unspecified part of body of right mandible, initial encounter for open fracture: Secondary | ICD-10-CM | POA: Diagnosis not present

## 2017-03-16 DIAGNOSIS — G9349 Other encephalopathy: Secondary | ICD-10-CM | POA: Diagnosis not present

## 2017-03-16 DIAGNOSIS — K011 Impacted teeth: Secondary | ICD-10-CM | POA: Diagnosis not present

## 2017-04-05 DIAGNOSIS — S0993XA Unspecified injury of face, initial encounter: Secondary | ICD-10-CM | POA: Diagnosis not present

## 2017-04-05 DIAGNOSIS — S02601G Fracture of unspecified part of body of right mandible, subsequent encounter for fracture with delayed healing: Secondary | ICD-10-CM | POA: Diagnosis not present

## 2017-04-05 DIAGNOSIS — G40909 Epilepsy, unspecified, not intractable, without status epilepticus: Secondary | ICD-10-CM | POA: Diagnosis not present

## 2017-06-02 DIAGNOSIS — G40219 Localization-related (focal) (partial) symptomatic epilepsy and epileptic syndromes with complex partial seizures, intractable, without status epilepticus: Secondary | ICD-10-CM | POA: Diagnosis not present

## 2017-06-02 DIAGNOSIS — Y831 Surgical operation with implant of artificial internal device as the cause of abnormal reaction of the patient, or of later complication, without mention of misadventure at the time of the procedure: Secondary | ICD-10-CM | POA: Diagnosis not present

## 2017-06-02 DIAGNOSIS — Z462 Encounter for fitting and adjustment of other devices related to nervous system and special senses: Secondary | ICD-10-CM | POA: Diagnosis not present

## 2017-06-02 DIAGNOSIS — T85113A Breakdown (mechanical) of implanted electronic neurostimulator, generator, initial encounter: Secondary | ICD-10-CM | POA: Diagnosis not present

## 2017-07-06 DIAGNOSIS — G40219 Localization-related (focal) (partial) symptomatic epilepsy and epileptic syndromes with complex partial seizures, intractable, without status epilepticus: Secondary | ICD-10-CM | POA: Diagnosis not present

## 2017-07-15 DIAGNOSIS — Z23 Encounter for immunization: Secondary | ICD-10-CM | POA: Diagnosis not present

## 2017-07-26 DIAGNOSIS — S0993XS Unspecified injury of face, sequela: Secondary | ICD-10-CM | POA: Diagnosis not present

## 2017-07-26 DIAGNOSIS — T847XXA Infection and inflammatory reaction due to other internal orthopedic prosthetic devices, implants and grafts, initial encounter: Secondary | ICD-10-CM | POA: Diagnosis not present

## 2017-07-26 DIAGNOSIS — F79 Unspecified intellectual disabilities: Secondary | ICD-10-CM | POA: Diagnosis not present

## 2017-07-26 DIAGNOSIS — G40909 Epilepsy, unspecified, not intractable, without status epilepticus: Secondary | ICD-10-CM | POA: Diagnosis not present

## 2017-07-29 DIAGNOSIS — G40909 Epilepsy, unspecified, not intractable, without status epilepticus: Secondary | ICD-10-CM | POA: Diagnosis not present

## 2017-07-29 DIAGNOSIS — T8579XA Infection and inflammatory reaction due to other internal prosthetic devices, implants and grafts, initial encounter: Secondary | ICD-10-CM | POA: Diagnosis not present

## 2017-07-29 DIAGNOSIS — F79 Unspecified intellectual disabilities: Secondary | ICD-10-CM | POA: Diagnosis not present

## 2017-07-29 DIAGNOSIS — Z880 Allergy status to penicillin: Secondary | ICD-10-CM | POA: Diagnosis not present

## 2017-07-29 DIAGNOSIS — S0993XS Unspecified injury of face, sequela: Secondary | ICD-10-CM | POA: Diagnosis not present

## 2017-07-29 DIAGNOSIS — T847XXA Infection and inflammatory reaction due to other internal orthopedic prosthetic devices, implants and grafts, initial encounter: Secondary | ICD-10-CM | POA: Diagnosis not present

## 2017-07-29 DIAGNOSIS — S02609S Fracture of mandible, unspecified, sequela: Secondary | ICD-10-CM | POA: Diagnosis not present

## 2017-07-29 DIAGNOSIS — T8469XA Infection and inflammatory reaction due to internal fixation device of other site, initial encounter: Secondary | ICD-10-CM | POA: Diagnosis not present

## 2017-08-18 DIAGNOSIS — R569 Unspecified convulsions: Secondary | ICD-10-CM | POA: Diagnosis not present

## 2017-08-18 DIAGNOSIS — F79 Unspecified intellectual disabilities: Secondary | ICD-10-CM | POA: Diagnosis not present

## 2017-08-18 DIAGNOSIS — G9340 Encephalopathy, unspecified: Secondary | ICD-10-CM | POA: Diagnosis not present

## 2017-08-18 DIAGNOSIS — G40802 Other epilepsy, not intractable, without status epilepticus: Secondary | ICD-10-CM | POA: Diagnosis not present

## 2017-08-18 DIAGNOSIS — G40909 Epilepsy, unspecified, not intractable, without status epilepticus: Secondary | ICD-10-CM | POA: Diagnosis not present

## 2017-08-18 DIAGNOSIS — T847XXA Infection and inflammatory reaction due to other internal orthopedic prosthetic devices, implants and grafts, initial encounter: Secondary | ICD-10-CM | POA: Diagnosis not present

## 2017-08-18 DIAGNOSIS — S0993XS Unspecified injury of face, sequela: Secondary | ICD-10-CM | POA: Diagnosis not present

## 2017-08-29 DIAGNOSIS — S20219A Contusion of unspecified front wall of thorax, initial encounter: Secondary | ICD-10-CM | POA: Diagnosis not present

## 2017-12-20 DIAGNOSIS — F79 Unspecified intellectual disabilities: Secondary | ICD-10-CM | POA: Diagnosis not present

## 2017-12-20 DIAGNOSIS — S0993XS Unspecified injury of face, sequela: Secondary | ICD-10-CM | POA: Diagnosis not present

## 2017-12-20 DIAGNOSIS — T847XXD Infection and inflammatory reaction due to other internal orthopedic prosthetic devices, implants and grafts, subsequent encounter: Secondary | ICD-10-CM | POA: Diagnosis not present

## 2018-02-22 DIAGNOSIS — R569 Unspecified convulsions: Secondary | ICD-10-CM | POA: Diagnosis not present

## 2018-02-22 DIAGNOSIS — Z9689 Presence of other specified functional implants: Secondary | ICD-10-CM | POA: Diagnosis not present

## 2018-02-22 DIAGNOSIS — G40219 Localization-related (focal) (partial) symptomatic epilepsy and epileptic syndromes with complex partial seizures, intractable, without status epilepticus: Secondary | ICD-10-CM | POA: Diagnosis not present

## 2018-03-11 DIAGNOSIS — Z1331 Encounter for screening for depression: Secondary | ICD-10-CM | POA: Diagnosis not present

## 2018-03-11 DIAGNOSIS — Z6833 Body mass index (BMI) 33.0-33.9, adult: Secondary | ICD-10-CM | POA: Diagnosis not present

## 2018-03-11 DIAGNOSIS — J302 Other seasonal allergic rhinitis: Secondary | ICD-10-CM | POA: Diagnosis not present

## 2018-04-18 ENCOUNTER — Encounter (HOSPITAL_COMMUNITY): Payer: Self-pay

## 2018-04-18 ENCOUNTER — Emergency Department (HOSPITAL_COMMUNITY): Payer: Medicare Other

## 2018-04-18 ENCOUNTER — Emergency Department (HOSPITAL_COMMUNITY)
Admission: EM | Admit: 2018-04-18 | Discharge: 2018-04-18 | Disposition: A | Discharge status: Home / Self Care | Payer: Medicare Other | Attending: Emergency Medicine | Admitting: Emergency Medicine

## 2018-04-18 DIAGNOSIS — R402441 Other coma, without documented Glasgow coma scale score, or with partial score reported, in the field [EMT or ambulance]: Secondary | ICD-10-CM | POA: Diagnosis not present

## 2018-04-18 DIAGNOSIS — R55 Syncope and collapse: Secondary | ICD-10-CM | POA: Diagnosis not present

## 2018-04-18 DIAGNOSIS — R05 Cough: Secondary | ICD-10-CM | POA: Diagnosis not present

## 2018-04-18 DIAGNOSIS — G40909 Epilepsy, unspecified, not intractable, without status epilepticus: Secondary | ICD-10-CM | POA: Diagnosis not present

## 2018-04-18 DIAGNOSIS — G40901 Epilepsy, unspecified, not intractable, with status epilepticus: Secondary | ICD-10-CM | POA: Diagnosis not present

## 2018-04-18 DIAGNOSIS — Z79899 Other long term (current) drug therapy: Secondary | ICD-10-CM | POA: Insufficient documentation

## 2018-04-18 DIAGNOSIS — R569 Unspecified convulsions: Secondary | ICD-10-CM | POA: Diagnosis not present

## 2018-04-18 DIAGNOSIS — E162 Hypoglycemia, unspecified: Secondary | ICD-10-CM | POA: Diagnosis not present

## 2018-04-18 LAB — COMPREHENSIVE METABOLIC PANEL
ALT: 11 U/L — AB (ref 17–63)
AST: 14 U/L — AB (ref 15–41)
Albumin: 3.7 g/dL (ref 3.5–5.0)
Alkaline Phosphatase: 81 U/L (ref 38–126)
Anion gap: 8 (ref 5–15)
BUN: 12 mg/dL (ref 6–20)
CHLORIDE: 106 mmol/L (ref 101–111)
CO2: 27 mmol/L (ref 22–32)
CREATININE: 0.93 mg/dL (ref 0.61–1.24)
Calcium: 9.1 mg/dL (ref 8.9–10.3)
GFR calc Af Amer: >60 mL/min (ref >60)
Glucose, Bld: 93 mg/dL (ref 65–99)
Potassium: 3.9 mmol/L (ref 3.5–5.1)
Sodium: 141 mmol/L (ref 135–145)
Total Bilirubin: 0.4 mg/dL (ref 0.3–1.2)
Total Protein: 7.1 g/dL (ref 6.5–8.1)

## 2018-04-18 LAB — CBC WITH DIFFERENTIAL/PLATELET
Abs Immature Granulocytes: 0.1 10*3/uL (ref 0.0–0.1)
Basophils Absolute: 0.1 10*3/uL (ref 0.0–0.1)
Basophils Relative: 1 %
EOS ABS: 0.1 10*3/uL (ref 0.0–0.7)
EOS PCT: 1 %
HEMATOCRIT: 38.7 % — AB (ref 39.0–52.0)
Hemoglobin: 12.3 g/dL — ABNORMAL LOW (ref 13.0–17.0)
Immature Granulocytes: 1 %
LYMPHS ABS: 2.6 10*3/uL (ref 0.7–4.0)
Lymphocytes Relative: 25 %
MCH: 26.6 pg (ref 26.0–34.0)
MCHC: 31.8 g/dL (ref 30.0–36.0)
MCV: 83.8 fL (ref 78.0–100.0)
MONO ABS: 0.9 10*3/uL (ref 0.1–1.0)
MONOS PCT: 9 %
Neutro Abs: 6.4 10*3/uL (ref 1.7–7.7)
Neutrophils Relative %: 63 %
Platelets: 319 10*3/uL (ref 150–400)
RBC: 4.62 MIL/uL (ref 4.22–5.81)
RDW: 13.9 % (ref 11.5–15.5)
WBC: 10.2 10*3/uL (ref 4.0–10.5)

## 2018-04-18 LAB — CBG MONITORING, ED: Glucose-Capillary: 85 mg/dL (ref 65–99)

## 2018-04-18 MED ORDER — ACETAMINOPHEN 500 MG PO TABS
1000.0000 mg | ORAL_TABLET | Freq: Once | ORAL | Status: AC
  Administered 2018-04-18: 1000 mg via ORAL
  Filled 2018-04-18: qty 2

## 2018-04-18 NOTE — ED Provider Notes (Signed)
[facility] Provider Note   CSN: [id number] Arrival date & time: 04/18/18  1139     History   Chief Complaint Chief Complaint  Patient presents with  . Seizures    HPI ICHIRO CHESNUT is a 37 y.o. male.  HPI Patient with seizure.  History of same with vagus nerve stimulator and on antiepileptics.  Normally has around 3 seizures a week but reportedly this 1 lasted longer.  He had been more out of it for an hour after.  Patient states he is feeling better now.  Has a dull headache which is typical for him.  States he has been taking his medicine but is not sleeping well due to his mother being in the hospital. Past Medical History:  Diagnosis Date  . Seizures ([place])     There are no active problems to display for this patient.   Past Surgical History:  Procedure Laterality Date  . BRAIN SURGERY Right 1992   2/3 frontal lobe removed.        Home Medications    Prior to Admission medications   Medication Sig Start Date End Date Taking? Authorizing Provider  clonazePAM (KLONOPIN) 0.5 MG tablet Take 0.5 mg by mouth at bedtime as needed for anxiety.    [provider]  ibuprofen (ADVIL,MOTRIN) 200 MG tablet Take 400 mg by mouth every 6 (six) hours as needed for pain.    [provider]  lacosamide (VIMPAT) 200 MG TABS tablet Take 400 mg by mouth 2 (two) times daily. Takes 2 tablets (400mg ) at 9am and 400mg  at 9pm    [provider]  lamoTRIgine (LAMICTAL) 200 MG tablet Take 400 mg by mouth at bedtime. Takes 2 tablets (400mg ) at 9pm    [provider]    Family History No family history on file.  Social History Social History   Tobacco Use  . Smoking status: Never Smoker  Substance Use Topics  . Alcohol use: No  . Drug use: Not on file     Allergies   Patient has no known allergies.   Review of Systems Review of Systems  Constitutional: Negative for appetite change and fever.  HENT:  Negative for congestion.   Respiratory: Negative for shortness of breath.   Cardiovascular: Negative for chest pain.  Gastrointestinal: Negative for abdominal pain.  Genitourinary: Negative for flank pain.  Neurological: Positive for seizures and headaches.  Psychiatric/Behavioral: Positive for confusion.     Physical Exam Updated Vital Signs BP 130/85   Pulse 86   Temp 97.7 F (36.5 C) (Oral)   Resp (!) 21   Ht 6\' 2"  (1.88 m)   SpO2 98%   Physical Exam  Constitutional: He appears well-developed.  HENT:  Head: Atraumatic.  Eyes: EOM are normal.  Neck: Neck supple.  Cardiovascular: Normal rate.  Pulmonary/Chest:  Vagal nerve stimulator to left anterior chest.  Abdominal: Soft.  Musculoskeletal: He exhibits no edema.  Neurological: He is alert.  Initial mild confusion improved during time in ER.  Skin: Skin is warm. Capillary refill takes less than 2 seconds.     ED Treatments / Results  Labs (all labs ordered are listed, but only abnormal results are displayed) Labs Reviewed  COMPREHENSIVE METABOLIC PANEL - Abnormal; Notable for the following components:      Result Value   AST 14 (*)    ALT 11 (*)    All other components within normal limits  CBC WITH DIFFERENTIAL/PLATELET - Abnormal; Notable for  the following components:   Hemoglobin 12.3 (*)    HCT 38.7 (*)    All other components within normal limits  CBG MONITORING, ED    EKG None  Radiology Dg Chest 2 View  Result Date: 04/18/2018 CLINICAL DATA:  Cough. EXAM: CHEST - 2 VIEW COMPARISON:  None. FINDINGS: The heart size and mediastinal contours are within normal limits. Both lungs are clear. The visualized skeletal structures are unremarkable. IMPRESSION: Normal examination. Electronically Signed   By: Claudie Revering M.D.   On: 04/18/2018 13:14    Procedures Procedures (including critical care time)  Medications Ordered in ED Medications  acetaminophen (TYLENOL) tablet 1,000 mg (1,000 mg Oral Given  04/18/18 1357)     Initial Impression / Assessment and Plan / ED Course  I have reviewed the triage vital signs and the nursing notes.  Pertinent labs & imaging results that were available during my care of the patient were reviewed by me and considered in my medical decision making (see chart for details).     Patient with seizure.  History of same.  Gradually improved back to baseline mental status.  Back at baseline per family member.  Discharge home.  Final Clinical Impressions(s) / ED Diagnoses   Final diagnoses:  Seizure [facility])    ED Discharge Orders    None       Davonna Belling, MD 04/18/18 2235

## 2018-04-18 NOTE — Discharge Instructions (Addendum)
Follow up with your neurologist.

## 2018-04-18 NOTE — ED Notes (Signed)
Patient transported to X-ray 

## 2018-04-18 NOTE — ED Triage Notes (Signed)
Pt BIB ems for witnessed seizure, pt has hx of same with implant in brain to help prevent seizures. Per pts dad, the pt is usually postictal for a while and then falls asleep but states this time is different because he has been unconscious for an hour. Pt responds to pain, unable to follow commands. VSS, nad

## 2018-09-01 DIAGNOSIS — Z23 Encounter for immunization: Secondary | ICD-10-CM | POA: Diagnosis not present

## 2018-11-03 DIAGNOSIS — Z6834 Body mass index (BMI) 34.0-34.9, adult: Secondary | ICD-10-CM | POA: Diagnosis not present

## 2018-11-03 DIAGNOSIS — J329 Chronic sinusitis, unspecified: Secondary | ICD-10-CM | POA: Diagnosis not present

## 2018-11-24 DIAGNOSIS — H35033 Hypertensive retinopathy, bilateral: Secondary | ICD-10-CM | POA: Diagnosis not present

## 2019-06-20 DIAGNOSIS — R569 Unspecified convulsions: Secondary | ICD-10-CM | POA: Diagnosis not present

## 2019-06-20 DIAGNOSIS — G40219 Localization-related (focal) (partial) symptomatic epilepsy and epileptic syndromes with complex partial seizures, intractable, without status epilepticus: Secondary | ICD-10-CM | POA: Diagnosis not present

## 2019-06-20 DIAGNOSIS — Z9689 Presence of other specified functional implants: Secondary | ICD-10-CM | POA: Diagnosis not present

## 2019-10-10 DIAGNOSIS — J302 Other seasonal allergic rhinitis: Secondary | ICD-10-CM | POA: Diagnosis not present

## 2019-11-29 IMAGING — DX DG CHEST 2V
2 series · 2 of 2 positions shown · non-contrast
Comparison: None.

CLINICAL DATA: Cough.

EXAM:
CHEST - 2 VIEW

[chest lat]
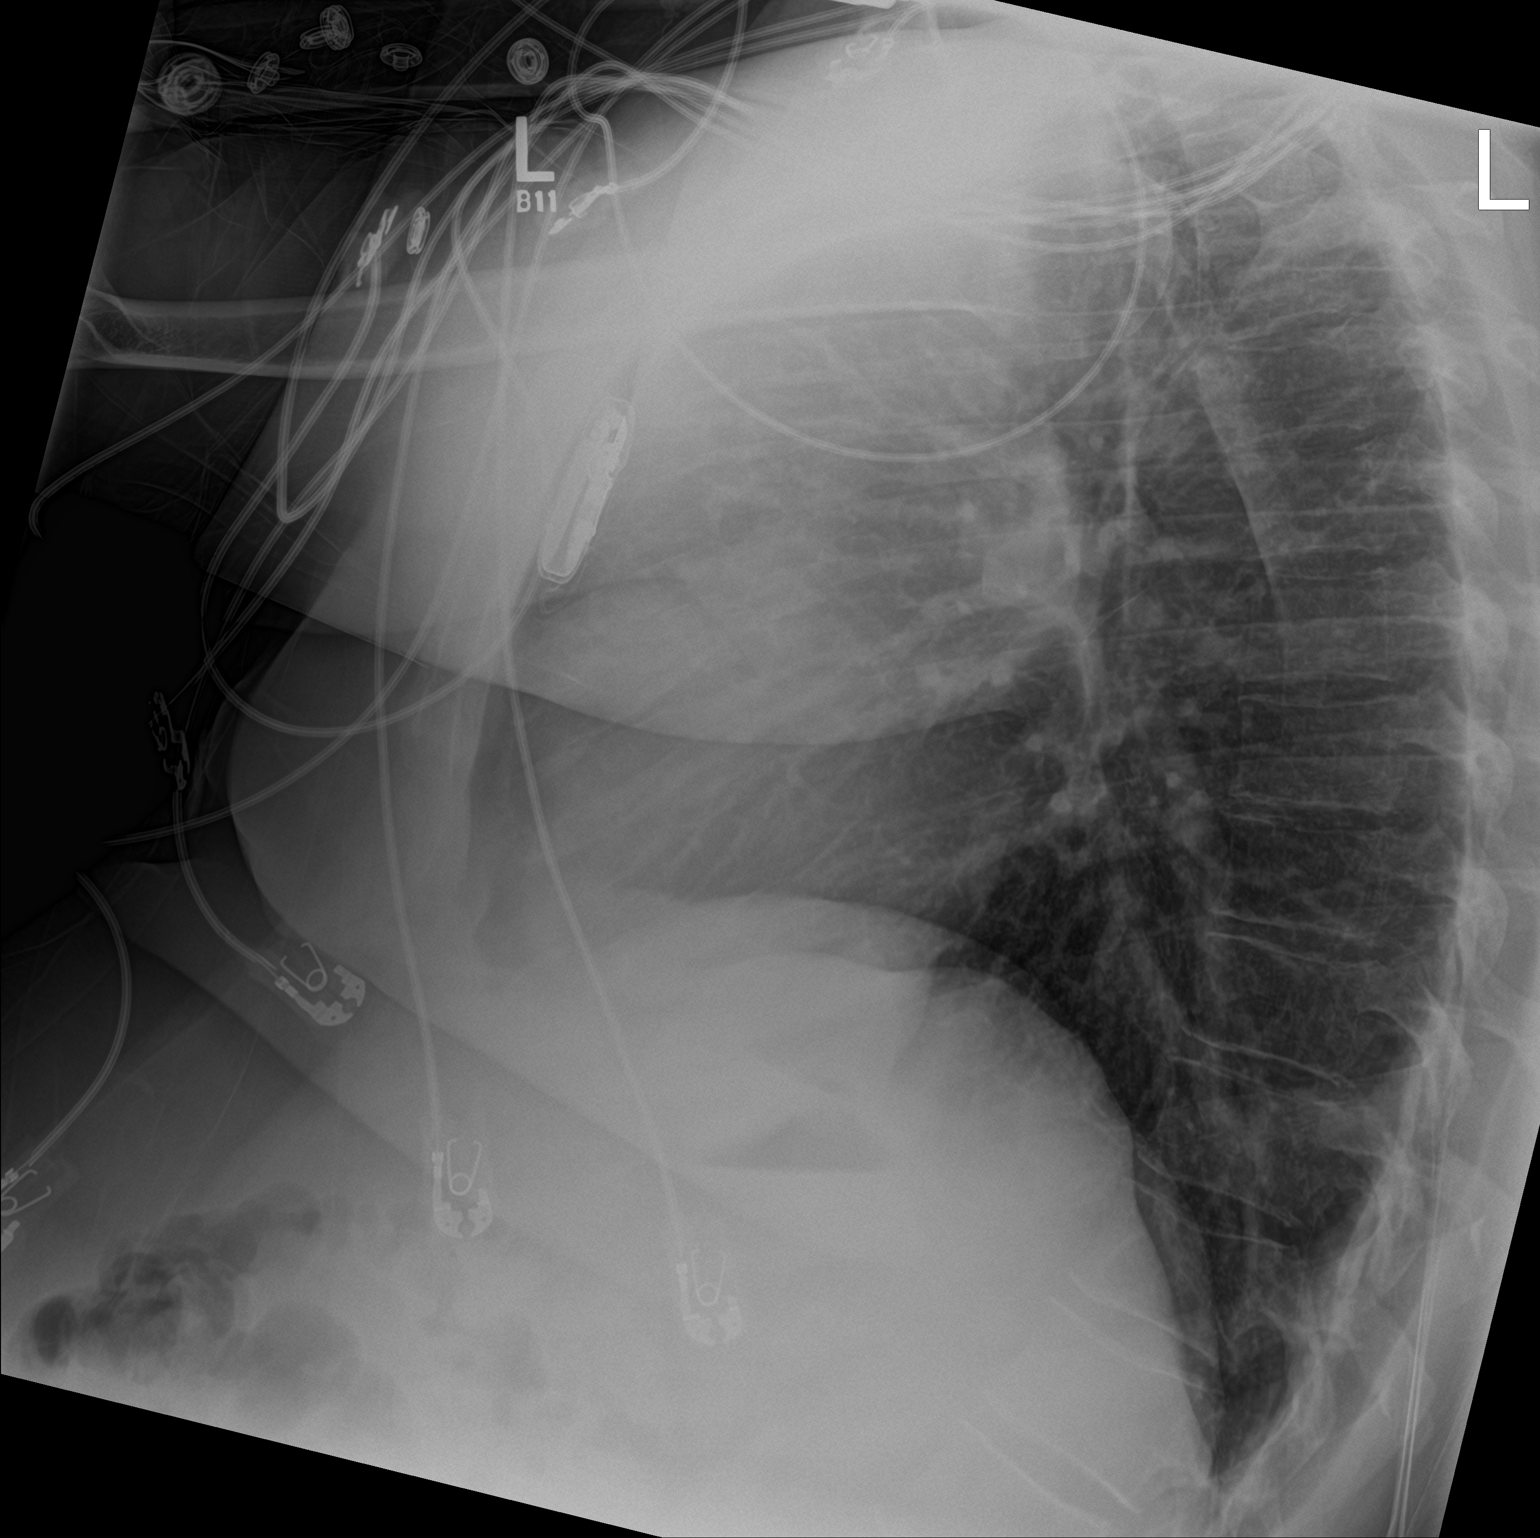

[chest ap]
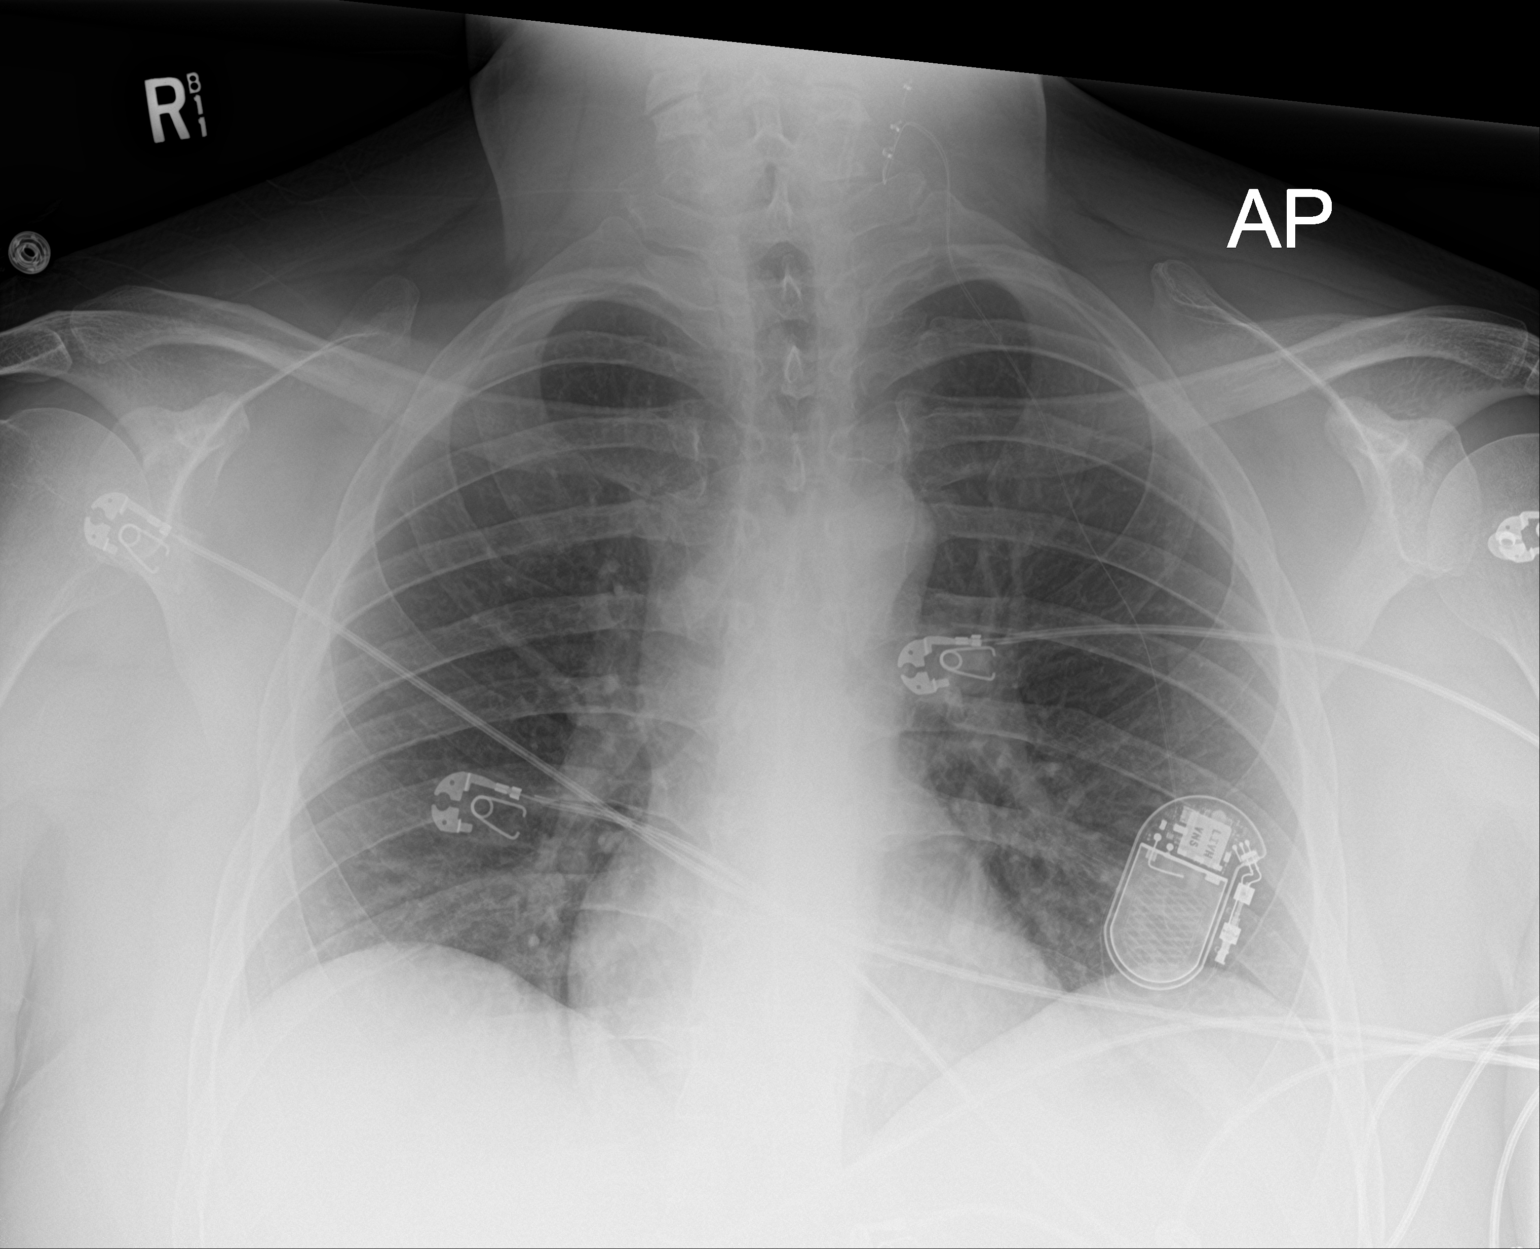

[2 of 2 positions shown; findings below may reference images not displayed]

FINDINGS: The heart size and mediastinal contours are within normal limits.
Both lungs are clear. The visualized skeletal structures are
unremarkable.
IMPRESSION: Normal examination.

## 2020-01-18 DIAGNOSIS — Z23 Encounter for immunization: Secondary | ICD-10-CM | POA: Diagnosis not present

## 2020-02-22 DIAGNOSIS — Z23 Encounter for immunization: Secondary | ICD-10-CM | POA: Diagnosis not present

## 2020-03-14 DIAGNOSIS — J302 Other seasonal allergic rhinitis: Secondary | ICD-10-CM | POA: Diagnosis not present

## 2020-03-14 DIAGNOSIS — G40909 Epilepsy, unspecified, not intractable, without status epilepticus: Secondary | ICD-10-CM | POA: Diagnosis not present

## 2020-03-19 DIAGNOSIS — G40219 Localization-related (focal) (partial) symptomatic epilepsy and epileptic syndromes with complex partial seizures, intractable, without status epilepticus: Secondary | ICD-10-CM | POA: Diagnosis not present

## 2020-08-23 DIAGNOSIS — R053 Chronic cough: Secondary | ICD-10-CM | POA: Diagnosis not present

## 2020-08-23 DIAGNOSIS — J329 Chronic sinusitis, unspecified: Secondary | ICD-10-CM | POA: Diagnosis not present

## 2020-08-23 DIAGNOSIS — J40 Bronchitis, not specified as acute or chronic: Secondary | ICD-10-CM | POA: Diagnosis not present

## 2020-08-27 DIAGNOSIS — R569 Unspecified convulsions: Secondary | ICD-10-CM | POA: Diagnosis not present

## 2020-08-27 DIAGNOSIS — G9349 Other encephalopathy: Secondary | ICD-10-CM | POA: Diagnosis not present

## 2020-08-27 DIAGNOSIS — R42 Dizziness and giddiness: Secondary | ICD-10-CM | POA: Diagnosis not present

## 2020-08-27 DIAGNOSIS — Z79899 Other long term (current) drug therapy: Secondary | ICD-10-CM | POA: Diagnosis not present

## 2020-08-27 DIAGNOSIS — G40919 Epilepsy, unspecified, intractable, without status epilepticus: Secondary | ICD-10-CM | POA: Diagnosis not present

## 2020-09-02 DIAGNOSIS — J329 Chronic sinusitis, unspecified: Secondary | ICD-10-CM | POA: Diagnosis not present

## 2020-09-02 DIAGNOSIS — G40209 Localization-related (focal) (partial) symptomatic epilepsy and epileptic syndromes with complex partial seizures, not intractable, without status epilepticus: Secondary | ICD-10-CM | POA: Diagnosis not present

## 2020-12-30 DIAGNOSIS — Z9689 Presence of other specified functional implants: Secondary | ICD-10-CM | POA: Diagnosis not present

## 2020-12-30 DIAGNOSIS — G40219 Localization-related (focal) (partial) symptomatic epilepsy and epileptic syndromes with complex partial seizures, intractable, without status epilepticus: Secondary | ICD-10-CM | POA: Diagnosis not present

## 2021-02-21 DIAGNOSIS — J302 Other seasonal allergic rhinitis: Secondary | ICD-10-CM | POA: Diagnosis not present

## 2021-06-16 DIAGNOSIS — R569 Unspecified convulsions: Secondary | ICD-10-CM | POA: Diagnosis not present

## 2021-06-16 DIAGNOSIS — Z9689 Presence of other specified functional implants: Secondary | ICD-10-CM | POA: Diagnosis not present

## 2021-06-18 DIAGNOSIS — R569 Unspecified convulsions: Secondary | ICD-10-CM | POA: Diagnosis not present

## 2021-06-18 DIAGNOSIS — Z9689 Presence of other specified functional implants: Secondary | ICD-10-CM | POA: Diagnosis not present

## 2021-07-16 DIAGNOSIS — G40919 Epilepsy, unspecified, intractable, without status epilepticus: Secondary | ICD-10-CM | POA: Diagnosis not present

## 2021-07-16 DIAGNOSIS — L905 Scar conditions and fibrosis of skin: Secondary | ICD-10-CM | POA: Diagnosis not present

## 2021-07-16 DIAGNOSIS — Z9689 Presence of other specified functional implants: Secondary | ICD-10-CM | POA: Diagnosis not present

## 2021-08-08 DIAGNOSIS — J329 Chronic sinusitis, unspecified: Secondary | ICD-10-CM | POA: Diagnosis not present

## 2021-09-08 DIAGNOSIS — T85193A Other mechanical complication of implanted electronic neurostimulator, generator, initial encounter: Secondary | ICD-10-CM | POA: Diagnosis not present

## 2021-09-08 DIAGNOSIS — Z6837 Body mass index (BMI) 37.0-37.9, adult: Secondary | ICD-10-CM | POA: Diagnosis not present

## 2021-09-08 DIAGNOSIS — Z79899 Other long term (current) drug therapy: Secondary | ICD-10-CM | POA: Diagnosis not present

## 2021-09-08 DIAGNOSIS — Z4542 Encounter for adjustment and management of neuropacemaker (brain) (peripheral nerve) (spinal cord): Secondary | ICD-10-CM | POA: Diagnosis not present

## 2021-09-08 DIAGNOSIS — E668 Other obesity: Secondary | ICD-10-CM | POA: Diagnosis not present

## 2021-09-08 DIAGNOSIS — G40219 Localization-related (focal) (partial) symptomatic epilepsy and epileptic syndromes with complex partial seizures, intractable, without status epilepticus: Secondary | ICD-10-CM | POA: Diagnosis not present

## 2021-09-24 DIAGNOSIS — Z4889 Encounter for other specified surgical aftercare: Secondary | ICD-10-CM | POA: Diagnosis not present

## 2021-09-24 DIAGNOSIS — Z9689 Presence of other specified functional implants: Secondary | ICD-10-CM | POA: Diagnosis not present

## 2021-09-24 DIAGNOSIS — G40509 Epileptic seizures related to external causes, not intractable, without status epilepticus: Secondary | ICD-10-CM | POA: Diagnosis not present

## 2021-11-15 DIAGNOSIS — S6991XA Unspecified injury of right wrist, hand and finger(s), initial encounter: Secondary | ICD-10-CM | POA: Diagnosis not present

## 2021-11-15 DIAGNOSIS — W19XXXA Unspecified fall, initial encounter: Secondary | ICD-10-CM | POA: Diagnosis not present

## 2021-11-19 DIAGNOSIS — M25531 Pain in right wrist: Secondary | ICD-10-CM | POA: Diagnosis not present

## 2021-11-19 DIAGNOSIS — S52501A Unspecified fracture of the lower end of right radius, initial encounter for closed fracture: Secondary | ICD-10-CM | POA: Diagnosis not present

## 2021-11-25 DIAGNOSIS — S52501A Unspecified fracture of the lower end of right radius, initial encounter for closed fracture: Secondary | ICD-10-CM | POA: Diagnosis not present

## 2021-12-10 DIAGNOSIS — S52501A Unspecified fracture of the lower end of right radius, initial encounter for closed fracture: Secondary | ICD-10-CM | POA: Diagnosis not present

## 2021-12-31 DIAGNOSIS — S52501A Unspecified fracture of the lower end of right radius, initial encounter for closed fracture: Secondary | ICD-10-CM | POA: Diagnosis not present

## 2022-03-23 DIAGNOSIS — R569 Unspecified convulsions: Secondary | ICD-10-CM | POA: Diagnosis not present

## 2023-04-21 DIAGNOSIS — G40219 Localization-related (focal) (partial) symptomatic epilepsy and epileptic syndromes with complex partial seizures, intractable, without status epilepticus: Secondary | ICD-10-CM | POA: Diagnosis not present

## 2024-02-23 DIAGNOSIS — Z9689 Presence of other specified functional implants: Secondary | ICD-10-CM | POA: Diagnosis not present

## 2024-02-23 DIAGNOSIS — G40919 Epilepsy, unspecified, intractable, without status epilepticus: Secondary | ICD-10-CM | POA: Diagnosis not present

## 2024-02-23 DIAGNOSIS — R569 Unspecified convulsions: Secondary | ICD-10-CM | POA: Diagnosis not present

## 2024-02-23 DIAGNOSIS — G40219 Localization-related (focal) (partial) symptomatic epilepsy and epileptic syndromes with complex partial seizures, intractable, without status epilepticus: Secondary | ICD-10-CM | POA: Diagnosis not present

## 2024-09-13 DIAGNOSIS — G40909 Epilepsy, unspecified, not intractable, without status epilepticus: Secondary | ICD-10-CM | POA: Diagnosis not present

## 2024-09-13 DIAGNOSIS — R030 Elevated blood-pressure reading, without diagnosis of hypertension: Secondary | ICD-10-CM | POA: Diagnosis not present

## 2024-09-13 DIAGNOSIS — Z6837 Body mass index (BMI) 37.0-37.9, adult: Secondary | ICD-10-CM | POA: Diagnosis not present

## 2024-09-13 DIAGNOSIS — E669 Obesity, unspecified: Secondary | ICD-10-CM | POA: Diagnosis not present

## 2024-09-13 DIAGNOSIS — Z5948 Other specified lack of adequate food: Secondary | ICD-10-CM | POA: Diagnosis not present

## 2024-09-13 DIAGNOSIS — Z5941 Food insecurity: Secondary | ICD-10-CM | POA: Diagnosis not present
